# Patient Record
Sex: Female | Born: 1935 | Race: White | Hispanic: No
Health system: Southern US, Community
[De-identification: ages and names within clinical notes are randomized; demographics above are authoritative.]

---

## 2013-09-18 ENCOUNTER — Emergency Department (HOSPITAL_COMMUNITY)
Admission: EM | Admit: 2013-09-18 | Discharge: 2013-09-18 | Disposition: A | Discharge status: Home / Self Care | Payer: Medicare Other | Attending: Emergency Medicine | Admitting: Emergency Medicine

## 2013-09-18 ENCOUNTER — Emergency Department (HOSPITAL_COMMUNITY): Payer: Medicare Other

## 2013-09-18 DIAGNOSIS — M79609 Pain in unspecified limb: Secondary | ICD-10-CM

## 2013-09-18 DIAGNOSIS — R110 Nausea: Secondary | ICD-10-CM | POA: Insufficient documentation

## 2013-09-18 DIAGNOSIS — R609 Edema, unspecified: Secondary | ICD-10-CM | POA: Insufficient documentation

## 2013-09-18 DIAGNOSIS — K2970 Gastritis, unspecified, without bleeding: Secondary | ICD-10-CM | POA: Insufficient documentation

## 2013-09-18 DIAGNOSIS — E785 Hyperlipidemia, unspecified: Secondary | ICD-10-CM | POA: Insufficient documentation

## 2013-09-18 DIAGNOSIS — I10 Essential (primary) hypertension: Secondary | ICD-10-CM | POA: Insufficient documentation

## 2013-09-18 DIAGNOSIS — R42 Dizziness and giddiness: Secondary | ICD-10-CM | POA: Insufficient documentation

## 2013-09-18 DIAGNOSIS — K2990 Gastroduodenitis, unspecified, without bleeding: Principal | ICD-10-CM

## 2013-09-18 DIAGNOSIS — J45909 Unspecified asthma, uncomplicated: Secondary | ICD-10-CM | POA: Insufficient documentation

## 2013-09-18 LAB — CBC WITH DIFFERENTIAL/PLATELET
BASOS ABS: 0 10*3/uL (ref 0.0–0.1)
Basophils Relative: 0 % (ref 0–1)
EOS ABS: 0.1 10*3/uL (ref 0.0–0.7)
EOS PCT: 2 % (ref 0–5)
HEMATOCRIT: 37.4 % (ref 36.0–46.0)
Hemoglobin: 12 g/dL (ref 12.0–15.0)
LYMPHS ABS: 1.6 10*3/uL (ref 0.7–4.0)
Lymphocytes Relative: 20 % (ref 12–46)
MCH: 31.5 pg (ref 26.0–34.0)
MCHC: 32.1 g/dL (ref 30.0–36.0)
MCV: 98.2 fL (ref 78.0–100.0)
Monocytes Absolute: 0.6 10*3/uL (ref 0.1–1.0)
Monocytes Relative: 7 % (ref 3–12)
Neutro Abs: 5.7 10*3/uL (ref 1.7–7.7)
Neutrophils Relative %: 71 % (ref 43–77)
PLATELETS: 221 10*3/uL (ref 150–400)
RBC: 3.81 MIL/uL — ABNORMAL LOW (ref 3.87–5.11)
RDW: 14.2 % (ref 11.5–15.5)
WBC: 8.1 10*3/uL (ref 4.0–10.5)

## 2013-09-18 LAB — COMPREHENSIVE METABOLIC PANEL
ALT: 11 U/L (ref 0–35)
AST: 15 U/L (ref 0–37)
Albumin: 3 g/dL — ABNORMAL LOW (ref 3.5–5.2)
Alkaline Phosphatase: 75 U/L (ref 39–117)
BUN: 19 mg/dL (ref 6–23)
CALCIUM: 8.6 mg/dL (ref 8.4–10.5)
CO2: 24 meq/L (ref 19–32)
CREATININE: 0.9 mg/dL (ref 0.50–1.10)
Chloride: 110 mEq/L (ref 96–112)
GFR, EST AFRICAN AMERICAN: 66 mL/min — AB (ref >90)
GFR, EST NON AFRICAN AMERICAN: 57 mL/min — AB (ref >90)
GLUCOSE: 113 mg/dL — AB (ref 70–99)
Potassium: 4.6 mEq/L (ref 3.7–5.3)
Sodium: 145 mEq/L (ref 137–147)
Total Bilirubin: 0.2 mg/dL — ABNORMAL LOW (ref 0.3–1.2)
Total Protein: 6 g/dL (ref 6.0–8.3)

## 2013-09-18 LAB — PRO B NATRIURETIC PEPTIDE: Pro B Natriuretic peptide (BNP): 146.8 pg/mL (ref 0–450)

## 2013-09-18 LAB — LIPASE, BLOOD: Lipase: 45 U/L (ref 11–59)

## 2013-09-18 LAB — TROPONIN I: Troponin I: <0.3 ng/mL (ref <0.30)

## 2013-09-18 MED ORDER — GI COCKTAIL ~~LOC~~
20.0000 mL | Freq: Once | ORAL | Status: AC
  Administered 2013-09-18: 20 mL via ORAL
  Filled 2013-09-18: qty 30

## 2013-09-18 MED ORDER — OMEPRAZOLE 40 MG PO CPDR: 40.0000 mg | DELAYED_RELEASE_CAPSULE | Freq: Every day | ORAL | Status: DC

## 2013-09-18 NOTE — ED Notes (Addendum)
Per EMS, pt began having chest pain described as pressure at 0600. Pt has had nausea but no vomiting. Received 4 mg of Zofran via 22 G IV in L hand. Pt allergic to aspirin. Per EMS, EKG en route was unremarkable. Per EMS, when patient points to pain it is more epigastric. MD at bedside at this time.

## 2013-09-18 NOTE — ED Provider Notes (Signed)
CSN: [id number]     Arrival date & time 09/18/13  1000 History   First MD Initiated Contact with Patient 09/18/13 1001     Chief Complaint  Patient presents with  . Chest Pain    HPI  [age] y.o. with h/o TIAs who reports fatigue and 'not feeling well' yesterday with mild shortness of breath that had no obvious trigger. This morning, she ate toast and black coffee and developed epigastric pain. She drinks 3 cups of coffee daily. Pain is more a "pressure" quality, 5/10, and does not radiate. She reports nausea and dizziness like she is going to pass out but denies emesis. Symptoms do not seem related to activity. She thinks she is 10 lbs overweight. She denies orthopnea and is unsure about PND as she has not lay down to sleep for some time for comfort. Denies new foods. This feels like her prior TIAs and unlike prior issues with gas, though she is currently more gassy than baseline. Zofran en route provided some relief. She denies confusion, focal weakness, change in vision, speech, hearing, or gait. She has had 2 weeks of leg swelling, R>L. Denies fevers, chills, diarrhea, emesis, or syncope.  Lives alone at home, walks without assistance and when outside of house with cane.  No past medical history on file. Reports asthma controlled with medication, HTN, HLD. Denies h/o cardiac issues.  No past surgical history on file. No family history on file. History  Substance Use Topics  . Smoking status: Not on file  . Smokeless tobacco: Not on file  . Alcohol Use: Not on file  Denies tobacco, drugs, or alcohol. Lives alone. Able to walk without assistance at home and with cane when outside.  OB History   No data available     Review of Systems  All other systems reviewed and are negative.   Allergies  Aspirin and Codeine  Home Medications   Prior to Admission medications   Not on File   BP 125/59  Pulse 65  Temp(Src) 98.5 F (36.9 C) (Oral)  Resp 16  SpO2 100% Physical Exam GEN:  NAD HEENT: Atraumatic, normocephalic, neck supple, EOMI, sclera clear. PERRL  CV: RRR, no murmurs, rubs, or gallops though distant PULM: CTAB, normal effort ABD: Soft, tender epigastrically, nondistended, NABS, no organomegaly, obese SKIN: bilateral LE mild erythema and swelling, no cyanosis; warm and well-perfused EXTR: Trace bilateral LE edema at ankles, no asymmetry, bilateral left medial calf tenderness PSYCH: Mood and affect euthymic, normal rate and volume of speech NEURO: Awake, alert, no focal deficits grossly, normal speech, CN 2-12 tested and intact, normal gait   ED Course  Procedures (including critical care time) Labs Review Labs Reviewed  CBC WITH DIFFERENTIAL - Abnormal; Notable for the following:    RBC 3.81 (*)    All other components within normal limits  COMPREHENSIVE METABOLIC PANEL - Abnormal; Notable for the following:    Glucose, Bld 113 (*)    Albumin 3.0 (*)    Total Bilirubin 0.2 (*)    GFR calc non Af Amer 57 (*)    GFR calc Af Amer 66 (*)    All other components within normal limits  TROPONIN I  PRO B NATRIURETIC PEPTIDE  LIPASE, BLOOD    Imaging Review Dg Chest 2 View  09/18/2013   CLINICAL DATA:  Chest pain and dyspnea  EXAM: CHEST  2 VIEW  COMPARISON:  None.  FINDINGS: The lungs are adequately inflated. The interstitial markings are somewhat coarse especially  in the right lung. There is parenchymal density in the right upper lobe that may reflect a nodule. The cardiopericardial silhouette is normal in size. The pulmonary vascularity is not engorged. The mediastinum is normal in width. There is mild tortuosity of the descending thoracic aorta. There is no pleural effusion. There is wedge compression of a mid thoracic vertebral body with loss of height of approximately 50%.  IMPRESSION: 1. There is abnormal density in the right upper lobe which may reflect a nodule. Chest CT scanning is recommended. 2. The interstitial markings are somewhat coarse elsewhere  in the right lung and are more normal in appearance on the left. 3. There is no evidence of CHF.  There is no pleural effusion. 4. There is wedge compression of a mid thoracic vertebral body.   Electronically Signed   By: David  [place]   On: 09/18/2013 11:47     EKG Interpretation   Date/Time:  Tuesday Sep 18 2013 10:06:51 EDT Ventricular Rate:  68 PR Interval:  185 QRS Duration: 91 QT Interval:  395 QTC Calculation: 420 R Axis:   -29 Text Interpretation:  Sinus rhythm Borderline left axis deviation Low  voltage, precordial leads Abnormal R-wave progression, early transition No  old tracing to compare Confirmed by CAMPOS  MD, Caryn BeeKEVIN ([id number]) on 09/18/2013  10:49:20 AM      MDM   Final diagnoses:  Gastritis   Likely GI though CXR with RUL focal finding that could be related to ischemia, though not typical wedge pattern.  Also with risk factors for cardiac. DDx also includes MSK and anxiety. Vitals stable, EKG unremarkable, troponin and BNP negative. Normal hemoglobin, CMET and lipase. Patient had pain and nausea relief with GI cocktail. - LE duplex prelim read negative. - Rx'ed omeprazole daily and recommended PCP f/u with return precautions reviewed. - Incidental RUL nodule on CXR - Suggested repeat CXR in 3 months.  Leona SingletonMaria T Adarian Bur, MD PGY-2, [facility]   Leona SingletonMaria T Ricci Dirocco, MD 09/18/13 1350

## 2013-09-18 NOTE — ED Provider Notes (Signed)
I saw and evaluated the patient, reviewed the resident's note and I agree with the findings and plan.   EKG Interpretation   Date/Time:  Tuesday Sep 18 2013 10:06:51 EDT Ventricular Rate:  68 PR Interval:  185 QRS Duration: 91 QT Interval:  395 QTC Calculation: 420 R Axis:   -29 Text Interpretation:  Sinus rhythm Borderline left axis deviation Low  voltage, precordial leads Abnormal R-wave progression, early transition No  old tracing to compare Confirmed by Merced Brougham  MD, Caryn BeeKEVIN ([id number]) on 09/18/2013  10:49:20 AM      Patient is overall well-appearing.  The majority of her symptoms seem more likely related to gastroesophageal reflux disease.  She is not on a PPI.  She'll be placed on this.  I discussed the importance of close followup for repeat x-ray for the questionable right-sided pulmonary nodule.  Bilateral lower extremity duplex is negative for DVT.  My suspicion for pulmonary embolism is very low.  She is not tachycardic or hypoxic.  She has no shortness of breath at this time.  Her upper abdominal pain lower chest pain resolved with GI cocktail.  Her EKG is unchanged.  Troponin is negative.  BNP is without significant abnormality.  Dg Chest 2 View  09/18/2013   CLINICAL DATA:  Chest pain and dyspnea  EXAM: CHEST  2 VIEW  COMPARISON:  None.  FINDINGS: The lungs are adequately inflated. The interstitial markings are somewhat coarse especially in the right lung. There is parenchymal density in the right upper lobe that may reflect a nodule. The cardiopericardial silhouette is normal in size. The pulmonary vascularity is not engorged. The mediastinum is normal in width. There is mild tortuosity of the descending thoracic aorta. There is no pleural effusion. There is wedge compression of a mid thoracic vertebral body with loss of height of approximately 50%.  IMPRESSION: 1. There is abnormal density in the right upper lobe which may reflect a nodule. Chest CT scanning is recommended. 2. The  interstitial markings are somewhat coarse elsewhere in the right lung and are more normal in appearance on the left. 3. There is no evidence of CHF.  There is no pleural effusion. 4. There is wedge compression of a mid thoracic vertebral body.   Electronically Signed   By: David  [place]   On: 09/18/2013 11:47  I personally reviewed the imaging tests through PACS system I reviewed available ER/hospitalization records through the EMR  Results for orders placed during the hospital encounter of 09/18/13  TROPONIN I      Result Value Ref Range   Troponin I <0.30  <0.30 ng/mL  PRO B NATRIURETIC PEPTIDE      Result Value Ref Range   Pro B Natriuretic peptide (BNP) 146.8  0 - 450 pg/mL  CBC WITH DIFFERENTIAL      Result Value Ref Range   WBC 8.1  4.0 - 10.5 K/uL   RBC 3.81 (*) 3.87 - 5.11 MIL/uL   Hemoglobin 12.0  12.0 - 15.0 g/dL   HCT 60.437.4  54.036.0 - 98.146.0 %   MCV 98.2  78.0 - 100.0 fL   MCH 31.5  26.0 - 34.0 pg   MCHC 32.1  30.0 - 36.0 g/dL   RDW 19.114.2  47.811.5 - 29.515.5 %   Platelets 221  150 - 400 K/uL   Neutrophils Relative % 71  43 - 77 %   Neutro Abs 5.7  1.7 - 7.7 K/uL   Lymphocytes Relative 20  12 - 46 %  Lymphs Abs 1.6  0.7 - 4.0 K/uL   Monocytes Relative 7  3 - 12 %   Monocytes Absolute 0.6  0.1 - 1.0 K/uL   Eosinophils Relative 2  0 - 5 %   Eosinophils Absolute 0.1  0.0 - 0.7 K/uL   Basophils Relative 0  0 - 1 %   Basophils Absolute 0.0  0.0 - 0.1 K/uL  COMPREHENSIVE METABOLIC PANEL      Result Value Ref Range   Sodium 145  137 - 147 mEq/L   Potassium 4.6  3.7 - 5.3 mEq/L   Chloride 110  96 - 112 mEq/L   CO2 24  19 - 32 mEq/L   Glucose, Bld 113 (*) 70 - 99 mg/dL   BUN 19  6 - 23 mg/dL   Creatinine, Ser 4.780.90  0.50 - 1.10 mg/dL   Calcium 8.6  8.4 - 29.510.5 mg/dL   Total Protein 6.0  6.0 - 8.3 g/dL   Albumin 3.0 (*) 3.5 - 5.2 g/dL   AST 15  0 - 37 U/L   ALT 11  0 - 35 U/L   Alkaline Phosphatase 75  39 - 117 U/L   Total Bilirubin 0.2 (*) 0.3 - 1.2 mg/dL   GFR calc non Af Amer 57  (*) >90 mL/min   GFR calc Af Amer 66 (*) >90 mL/min  LIPASE, BLOOD      Result Value Ref Range   Lipase 45  11 - 59 U/L     Lyanne CoKevin M Kayra Crowell, MD 09/18/13 1355

## 2013-09-18 NOTE — Progress Notes (Signed)
*  Preliminary Results* Bilateral lower extremity venous duplex completed. Bilateral lower extremities are negative for deep vein thrombosis. There is no evidence of Baker's cyst bilaterally.  09/18/2013  Gertie FeyMichelle Raju Coppolino, RVT, RDCS, RDMS

## 2013-09-18 NOTE — Discharge Instructions (Signed)
Follow up with your primary doctor. In 3 months you should have a repeat chest x ray to re-evaluate the nodule we found on today's x ray in your right upper lobe. However, we were unable to find a cause for your difficulty breathing and your labs and ekg were all normal. It is likely due to acid reflux. Take prilosec daily and cut back on coffee/caffeine intake. If you develop worsened chest pain, severe shortness of breath, bloody vomit, or other concerns, seek immediate care.

## 2013-09-18 NOTE — ED Notes (Signed)
Doctor at bedside.

## 2013-11-11 ENCOUNTER — Other Ambulatory Visit: Payer: Self-pay | Admitting: Sports Medicine

## 2013-11-19 ENCOUNTER — Other Ambulatory Visit: Payer: Self-pay | Admitting: Sports Medicine

## 2015-07-13 IMAGING — CR DG CHEST 2V
2 series · 2 of 2 positions shown · non-contrast
Comparison: None.

CLINICAL DATA: Chest pain and dyspnea

EXAM:
CHEST  2 VIEW

[w chest pa]
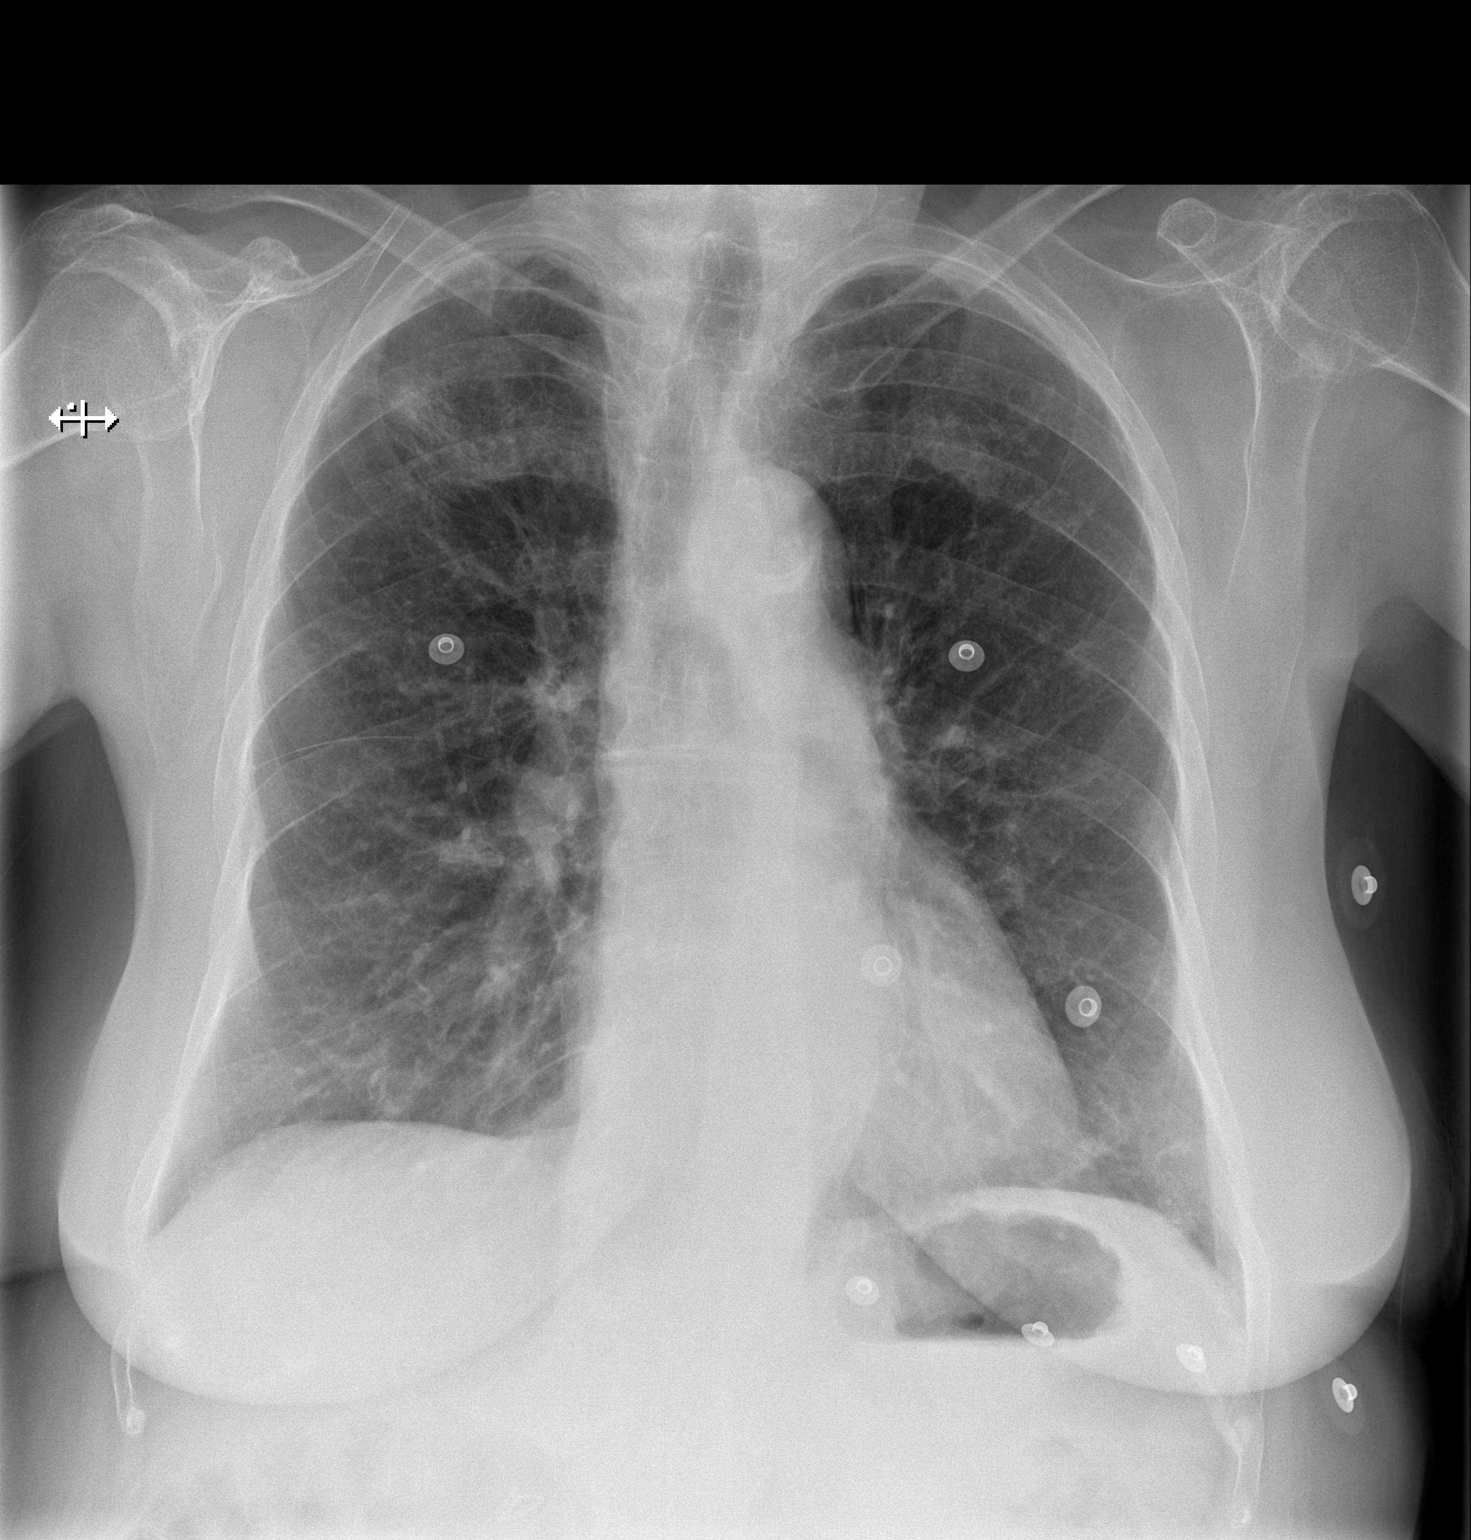

[w chest lat]
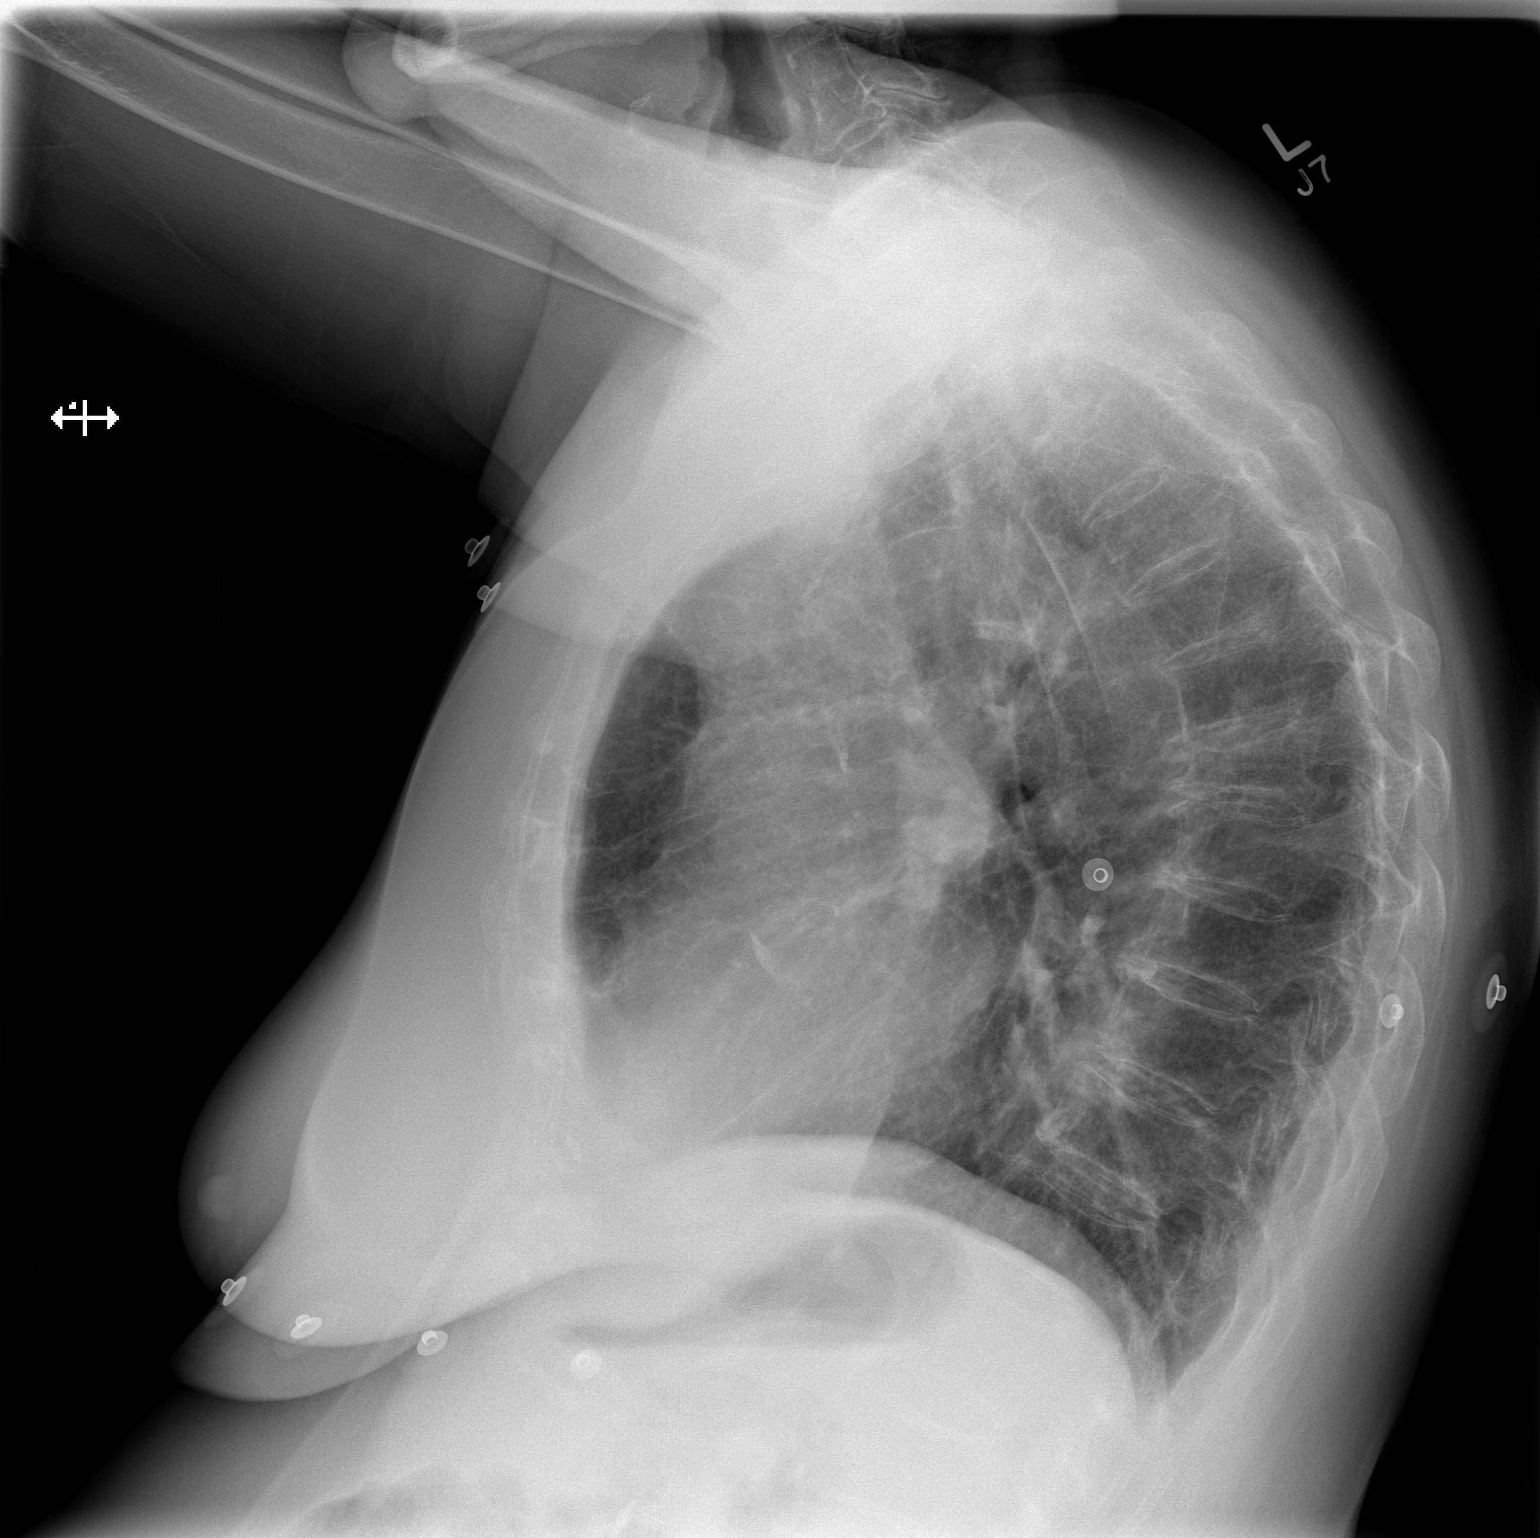

[2 of 2 positions shown; findings below may reference images not displayed]

FINDINGS: The lungs are adequately inflated. The interstitial markings are
somewhat coarse especially in the right lung. There is parenchymal
density in the right upper lobe that may reflect a nodule. The
cardiopericardial silhouette is normal in size. The pulmonary
vascularity is not engorged. The mediastinum is normal in width.
There is mild tortuosity of the descending thoracic aorta. There is
no pleural effusion. There is wedge compression of a mid thoracic
vertebral body with loss of height of approximately 50%.
IMPRESSION: 1. There is abnormal density in the right upper lobe which may
reflect a nodule. Chest CT scanning is recommended.
2. The interstitial markings are somewhat coarse elsewhere in the
right lung and are more normal in appearance on the left.
3. There is no evidence of CHF.  There is no pleural effusion.
4. There is wedge compression of a mid thoracic vertebral body.

## 2017-01-01 DEATH — deceased
